# Patient Record
Sex: Female | Born: 1995
Health system: Southern US, Community
[De-identification: ages and names within clinical notes are randomized; demographics above are authoritative.]

---

## 2014-05-12 ENCOUNTER — Encounter: Payer: Self-pay | Admitting: Obstetrics & Gynecology

## 2014-07-18 ENCOUNTER — Encounter: Payer: Self-pay | Admitting: Emergency Medicine

## 2014-07-18 ENCOUNTER — Emergency Department
Admission: EM | Admit: 2014-07-18 | Discharge: 2014-07-18 | Disposition: A | Discharge status: Home / Self Care | Payer: BC Managed Care – PPO | Source: Home / Self Care | Attending: Emergency Medicine | Admitting: Emergency Medicine

## 2014-07-18 DIAGNOSIS — J208 Acute bronchitis due to other specified organisms: Secondary | ICD-10-CM

## 2014-07-18 MED ORDER — PREDNISONE 10 MG PO TABS: ORAL_TABLET | ORAL | Status: DC

## 2014-07-18 MED ORDER — AZITHROMYCIN 250 MG PO TABS: ORAL_TABLET | ORAL | Status: DC

## 2014-07-18 MED ORDER — ALBUTEROL SULFATE HFA 108 (90 BASE) MCG/ACT IN AERS: 1.0000 | INHALATION_SPRAY | Freq: Four times a day (QID) | RESPIRATORY_TRACT | Status: DC | PRN

## 2014-07-18 NOTE — Discharge Instructions (Signed)

## 2014-07-18 NOTE — ED Notes (Signed)
Cold and cough symptoms x 9 days.  Not getting better.  Pain in ribs with coughing and inspirations.

## 2014-07-18 NOTE — ED Provider Notes (Signed)
CSN: [id number]     Arrival date & time 07/18/14  1646 History   First MD Initiated Contact with Patient 07/18/14 1712     Chief Complaint  Patient presents with  . Cough  . Nasal Congestion   (Consider location/radiation/quality/duration/timing/severity/associated sxs/prior Treatment) Patient is a 18 y.o. female presenting with cough. The history is provided by the patient. No language interpreter was used.  Cough Cough characteristics:  Productive Sputum characteristics:  Clear Severity:  Moderate Onset quality:  Gradual Timing:  Constant Progression:  Worsening Chronicity:  New Smoker: no   Context: upper respiratory infection   Relieved by:  Nothing Worsened by:  Nothing tried Ineffective treatments:  Beta-agonist inhaler Associated symptoms: fever and shortness of breath   Associated symptoms: no sore throat     History reviewed. No pertinent past medical history. History reviewed. No pertinent past surgical history. History reviewed. No pertinent family history. History  Substance Use Topics  . Smoking status: Never Smoker   . Smokeless tobacco: Never Used  . Alcohol Use: No   OB History   Grav Para Term Preterm Abortions TAB SAB Ect Mult Living                 Review of Systems  Constitutional: Positive for fever.  HENT: Negative for sore throat.   Respiratory: Positive for cough and shortness of breath.   All other systems reviewed and are negative.   Allergies  Amoxicillin  Home Medications   Prior to Admission medications   Medication Sig Start Date End Date Taking? Authorizing Provider  levonorgestrel-ethinyl estradiol (SEASONALE,INTROVALE,JOLESSA) 0.15-0.03 MG tablet Take 1 tablet by mouth daily.   Yes Historical Provider, MD  albuterol (PROVENTIL HFA;VENTOLIN HFA) 108 (90 BASE) MCG/ACT inhaler Inhale 1-2 puffs into the lungs every 6 (six) hours as needed for wheezing or shortness of breath. 07/18/14   Elson AreasLeslie K Sofia, PA-C  azithromycin (ZITHROMAX  Z-PAK) 250 MG tablet Two tablets day1 then One tablet a day days 2-5 07/18/14   Elson AreasLeslie K Sofia, PA-C  predniSONE (DELTASONE) 10 MG tablet 6,5,4,3,2,1 taper 07/18/14   Elson AreasLeslie K Sofia, PA-C   BP 119/74  Pulse 88  Temp(Src) 97.6 F (36.4 C) (Oral)  Resp 16  Ht 5\' 3"  (1.6 m)  Wt 118 lb (53.524 kg)  BMI 20.91 kg/m2  SpO2 98%  LMP 04/01/2014 Physical Exam  Nursing note and vitals reviewed. Constitutional: She is oriented to person, place, and time. She appears well-developed and well-nourished.  HENT:  Head: Normocephalic.  Eyes: EOM are normal.  Neck: Normal range of motion.  Pulmonary/Chest: Effort normal.  Abdominal: She exhibits no distension.  Musculoskeletal: Normal range of motion.  Neurological: She is alert and oriented to person, place, and time.  Psychiatric: She has a normal mood and affect.    ED Course  Procedures (including critical care time) Labs Review Labs Reviewed - No data to display  Imaging Review No results found.   MDM   1. Acute bronchitis due to other specified organisms    zithromax Prednisone Albuterol inhaler Return if any problems.    Lonia SkinnerLeslie K WalkertonSofia, PA-C 07/18/14 1731

## 2015-02-28 ENCOUNTER — Encounter: Payer: Self-pay | Admitting: *Deleted

## 2015-02-28 ENCOUNTER — Emergency Department
Admission: EM | Admit: 2015-02-28 | Discharge: 2015-02-28 | Disposition: A | Discharge status: Home / Self Care | Payer: BLUE CROSS/BLUE SHIELD | Source: Home / Self Care | Attending: Family Medicine | Admitting: Family Medicine

## 2015-02-28 DIAGNOSIS — J029 Acute pharyngitis, unspecified: Secondary | ICD-10-CM | POA: Diagnosis not present

## 2015-02-28 LAB — POCT RAPID STREP A (OFFICE): Rapid Strep A Screen: NEGATIVE

## 2015-02-28 MED ORDER — PREDNISONE 20 MG PO TABS: 20.0000 mg | ORAL_TABLET | Freq: Two times a day (BID) | ORAL | Status: DC

## 2015-02-28 NOTE — Discharge Instructions (Signed)
Try warm salt water gargles for sore throat.  ° ° °Salt Water Gargle °This solution will help make your mouth and throat feel better. °HOME CARE INSTRUCTIONS  °· Mix 1 teaspoon of salt in 8 ounces of warm water. °· Gargle with this solution as much or often as you need or as directed. Swish and gargle gently if you have any sores or wounds in your mouth. °· Do not swallow this mixture. °Document Released: 06/08/2004 Document Revised: 11/27/2011 Document Reviewed: 10/30/2008 °ExitCare® Patient Information ©2015 [employer]. This information is not intended to replace advice given to you by your health care provider. Make sure you discuss any questions you have with your health care provider. ° °

## 2015-02-28 NOTE — ED Provider Notes (Signed)
CSN: [id number]     Arrival date & time 02/28/15  1417 History   First MD Initiated Contact with Patient 02/28/15 1457     Chief Complaint  Patient presents with  . Sore Throat      HPI Comments: Patient complains of left side sore throat for one week without other symptoms.  No fevers, chills, and sweats.  She feels well otherwise.  The history is provided by the patient and a parent.    History reviewed. No pertinent past medical history. History reviewed. No pertinent past surgical history. History reviewed. No pertinent family history. History  Substance Use Topics  . Smoking status: Never Smoker   . Smokeless tobacco: Never Used  . Alcohol Use: No   OB History    No data available     Review of Systems + sore throat No cough No pleuritic pain No wheezing No nasal congestion No post-nasal drainage No sinus pain/pressure No itchy/red eyes No earache No hemoptysis No SOB No fever/chills No nausea No vomiting No abdominal pain No diarrhea No urinary symptoms No skin rash No fatigue No myalgias No headache Used OTC meds without relief  Allergies  Amoxicillin  Home Medications   Prior to Admission medications   Medication Sig Start Date End Date Taking? Authorizing Provider  ibuprofen (ADVIL,MOTRIN) 400 MG tablet Take 400 mg by mouth every 6 (six) hours as needed.   Yes Historical Provider, MD  albuterol (PROVENTIL HFA;VENTOLIN HFA) 108 (90 BASE) MCG/ACT inhaler Inhale 1-2 puffs into the lungs every 6 (six) hours as needed for wheezing or shortness of breath. 07/18/14   Elson Areas, PA-C  levonorgestrel-ethinyl estradiol (SEASONALE,INTROVALE,JOLESSA) 0.15-0.03 MG tablet Take 1 tablet by mouth daily.    Historical Provider, MD  predniSONE (DELTASONE) 20 MG tablet Take 1 tablet (20 mg total) by mouth 2 (two) times daily. Take with food. 02/28/15   Lattie Haw, MD   BP 117/71 mmHg  Pulse 77  Temp(Src) 98.3 F (36.8 C) (Oral)  Ht 5\' 3"  (1.6 m)  Wt 120  lb 8 oz (54.658 kg)  BMI 21.35 kg/m2  SpO2 98% Physical Exam Nursing notes and Vital Signs reviewed. Appearance:  Patient appears stated age, and in no acute distress Eyes:  Pupils are equal, round, and reactive to light and accomodation.  Extraocular movement is intact.  Conjunctivae are not inflamed  Ears:  Canals normal.  Tympanic membranes normal.  Nose:  Normal turbinates.  No sinus tenderness.     Pharynx:  Normal Neck:  Supple.  Anterior and posterior nodes are not tender or enlarged.  Left submandibular nodes are tender but not enlarged. Lungs:  Clear to auscultation.  Breath sounds are equal.  Heart:  Regular rate and rhythm without murmurs, rubs, or gallops.  Skin:  No rash present.   ED Course  Procedures  None    Labs Reviewed  STREP A DNA PROBE  POCT RAPID STREP A (OFFICE) negative      MDM   1. Acute pharyngitis, unspecified pharyngitis type; ?etiology    Throat culture pending. Begin prednisone burst. Try warm salt water gargles for sore throat.  Followup with ENT if not resolved one week.    Lattie Haw, MD 02/28/15 [phone]

## 2015-02-28 NOTE — ED Notes (Signed)
Pt complains of sore throat for 1 wk.  She states it only hurts on the L side and is a sharp pain when swalling.  She says her Lympnode on L side has been swollen and tender.

## 2015-03-01 LAB — STREP A DNA PROBE: GASP: NEGATIVE

## 2015-03-03 ENCOUNTER — Telehealth: Payer: Self-pay | Admitting: *Deleted

## 2015-11-01 ENCOUNTER — Encounter: Payer: Self-pay | Admitting: *Deleted

## 2015-11-01 ENCOUNTER — Emergency Department
Admission: EM | Admit: 2015-11-01 | Discharge: 2015-11-01 | Disposition: A | Discharge status: Home / Self Care | Payer: BLUE CROSS/BLUE SHIELD | Source: Home / Self Care | Attending: Family Medicine | Admitting: Family Medicine

## 2015-11-01 DIAGNOSIS — N390 Urinary tract infection, site not specified: Secondary | ICD-10-CM

## 2015-11-01 DIAGNOSIS — R300 Dysuria: Secondary | ICD-10-CM | POA: Diagnosis not present

## 2015-11-01 LAB — POCT URINALYSIS DIP (MANUAL ENTRY)
Glucose, UA: NEGATIVE
Nitrite, UA: POSITIVE — AB
SPEC GRAV UA: 1.015 (ref 1.005–1.03)
UROBILINOGEN UA: 1 (ref 0–1)
pH, UA: 6.5 (ref 5–8)

## 2015-11-01 MED ORDER — PHENAZOPYRIDINE HCL 200 MG PO TABS: 200.0000 mg | ORAL_TABLET | Freq: Three times a day (TID) | ORAL | Status: DC

## 2015-11-01 MED ORDER — CIPROFLOXACIN HCL 500 MG PO TABS: 500.0000 mg | ORAL_TABLET | Freq: Two times a day (BID) | ORAL | Status: DC

## 2015-11-01 NOTE — ED Notes (Signed)
Pt c/o dysuria and LBP x 2 days. She has taken Cranberry pills. Denies fever. She took Aleve at 0800 today.

## 2015-11-01 NOTE — Discharge Instructions (Signed)
Increase fluid intake. °If symptoms become significantly worse during the night or over the weekend, proceed to the local emergency room.  ° ° °Urinary Tract Infection °Urinary tract infections (UTIs) can develop anywhere along your urinary tract. Your urinary tract is your body's drainage system for removing wastes and extra water. Your urinary tract includes two kidneys, two ureters, a bladder, and a urethra. Your kidneys are a pair of bean-shaped organs. Each kidney is about the size of your fist. They are located below your ribs, one on each side of your spine. °CAUSES °Infections are caused by microbes, which are microscopic organisms, including fungi, viruses, and bacteria. These organisms are so small that they can only be seen through a microscope. Bacteria are the microbes that most commonly cause UTIs. °SYMPTOMS  °Symptoms of UTIs may vary by age and gender of the patient and by the location of the infection. Symptoms in young women typically include a frequent and intense urge to urinate and a painful, burning feeling in the bladder or urethra during urination. Older women and men are more likely to be tired, shaky, and weak and have muscle aches and abdominal pain. A fever may mean the infection is in your kidneys. Other symptoms of a kidney infection include pain in your back or sides below the ribs, nausea, and vomiting. °DIAGNOSIS °To diagnose a UTI, your caregiver will ask you about your symptoms. Your caregiver will also ask you to provide a urine sample. The urine sample will be tested for bacteria and white blood cells. White blood cells are made by your body to help fight infection. °TREATMENT  °Typically, UTIs can be treated with medication. Because most UTIs are caused by a bacterial infection, they usually can be treated with the use of antibiotics. The choice of antibiotic and length of treatment depend on your symptoms and the type of bacteria causing your infection. °HOME CARE  INSTRUCTIONS °· If you were prescribed antibiotics, take them exactly as your caregiver instructs you. Finish the medication even if you feel better after you have only taken some of the medication. °· Drink enough water and fluids to keep your urine clear or pale yellow. °· Avoid caffeine, tea, and carbonated beverages. They tend to irritate your bladder. °· Empty your bladder often. Avoid holding urine for long periods of time. °· Empty your bladder before and after sexual intercourse. °· After a bowel movement, women should cleanse from front to back. Use each tissue only once. °SEEK MEDICAL CARE IF:  °· You have back pain. °· You develop a fever. °· Your symptoms do not begin to resolve within 3 days. °SEEK IMMEDIATE MEDICAL CARE IF:  °· You have severe back pain or lower abdominal pain. °· You develop chills. °· You have nausea or vomiting. °· You have continued burning or discomfort with urination. °MAKE SURE YOU:  °· Understand these instructions. °· Will watch your condition. °· Will get help right away if you are not doing well or get worse. °  °This information is not intended to replace advice given to you by your health care provider. Make sure you discuss any questions you have with your health care provider. °  °Document Released: 06/14/2005 Document Revised: 05/26/2015 Document Reviewed: 10/13/2011 °Elsevier Interactive Patient Education ©2016 [employer] ° °

## 2015-11-01 NOTE — ED Provider Notes (Signed)
CSN: [id number]     Arrival date & time 11/01/15  1821 History   First MD Initiated Contact with Patient 11/01/15 1903     Chief Complaint  Patient presents with  . Dysuria      HPI Comments: Patient complains of one week history of urinary frequency, dysuria, nocturia, and low back ache, becoming worse over the past two days with fever/chills, and nausea.  Patient is a 20 y.o. female presenting with dysuria. The history is provided by the patient.  Dysuria Pain quality:  Burning Pain severity:  Mild Onset quality:  Gradual Duration:  1 week Timing:  Constant Progression:  Worsening Chronicity:  New Recent urinary tract infections: no   Relieved by:  Nothing Worsened by:  Nothing tried Ineffective treatments:  Cranberry juice Urinary symptoms: frequent urination and hesitancy   Urinary symptoms: no discolored urine, no foul-smelling urine, no hematuria and no bladder incontinence   Associated symptoms: fever and nausea   Associated symptoms: no abdominal pain, no flank pain, no genital lesions, no vaginal discharge and no vomiting     History reviewed. No pertinent past medical history. History reviewed. No pertinent past surgical history. History reviewed. No pertinent family history. Social History  Substance Use Topics  . Smoking status: Never Smoker   . Smokeless tobacco: Never Used  . Alcohol Use: No   OB History    No data available     Review of Systems  Constitutional: Positive for fever.  Gastrointestinal: Positive for nausea. Negative for vomiting and abdominal pain.  Genitourinary: Positive for dysuria. Negative for flank pain and vaginal discharge.  All other systems reviewed and are negative.   Allergies  Amoxicillin  Home Medications   Prior to Admission medications   Medication Sig Start Date End Date Taking? Authorizing Provider  albuterol (PROVENTIL HFA;VENTOLIN HFA) 108 (90 BASE) MCG/ACT inhaler Inhale 1-2 puffs into the lungs every 6 (six)  hours as needed for wheezing or shortness of breath. 07/18/14   Elson Areas, PA-C  ciprofloxacin (CIPRO) 500 MG tablet Take 1 tablet (500 mg total) by mouth 2 (two) times daily. 11/01/15   Lattie Haw, MD  levonorgestrel-ethinyl estradiol (SEASONALE,INTROVALE,JOLESSA) 0.15-0.03 MG tablet Take 1 tablet by mouth daily.    Historical Provider, MD  phenazopyridine (PYRIDIUM) 200 MG tablet Take 1 tablet (200 mg total) by mouth 3 (three) times daily. Take after meals. 11/01/15   Lattie Haw, MD   Meds Ordered and Administered this Visit  Medications - No data to display  BP 122/76 mmHg  Pulse 63  Temp(Src) 98.1 F (36.7 C) (Oral)  Resp 16  Ht  (1.6 m)  Wt 123 lb (55.792 kg)  BMI 21.79 kg/m2  SpO2 100%  LMP 10/25/2015 No data found.   Physical Exam Nursing notes and Vital Signs reviewed. Appearance:  Patient appears stated age, and in no acute distress.    Eyes:  Pupils are equal, round, and reactive to light and accomodation.  Extraocular movement is intact.  Conjunctivae are not inflamed   Pharynx:  Normal; moist mucous membranes  Neck:  Supple.  No adenopathy Lungs:  Clear to auscultation.  Breath sounds are equal.  Moving air well. Heart:  Regular rate and rhythm without murmurs, rubs, or gallops.  Abdomen:  Nontender without masses or hepatosplenomegaly.  Bowel sounds are present.  No CVA or flank tenderness.  Extremities:  No edema.  Skin:  No rash present.    ED Course  Procedures  None  Labs Reviewed  POCT URINALYSIS DIP (MANUAL ENTRY) - Abnormal; Notable for the following:    Color, UA brown (*)    Clarity, UA cloudy (*)    Bilirubin, UA small (*)    Ketones, POC UA trace (5) (*)    Blood, UA large (*)    Protein Ur, POC >=300 (*)    Nitrite, UA Positive (*)    Leukocytes, UA large (3+) (*)    All other components within normal limits  URINE CULTURE      MDM   1. Dysuria   2. Urinary tract infection without hematuria, site unspecified    Urine  culture pending. Begin Cipro  BID.  Rx for Pyridium. Increase fluid intake. If symptoms become significantly worse during the night or over the weekend, proceed to the local emergency room.  Followup with Family Doctor if not improved in one week.     Lattie Haw, MD 11/08/15 [phone]

## 2015-11-04 ENCOUNTER — Telehealth: Payer: Self-pay | Admitting: *Deleted

## 2015-11-04 LAB — URINE CULTURE: Colony Count: >=100000

## 2016-06-13 DIAGNOSIS — Z0000 Encounter for general adult medical examination without abnormal findings: Secondary | ICD-10-CM | POA: Diagnosis not present

## 2016-06-13 DIAGNOSIS — Z6823 Body mass index (BMI) 23.0-23.9, adult: Secondary | ICD-10-CM | POA: Diagnosis not present

## 2016-07-11 ENCOUNTER — Ambulatory Visit (INDEPENDENT_AMBULATORY_CARE_PROVIDER_SITE_OTHER): Payer: BLUE CROSS/BLUE SHIELD | Admitting: Obstetrics & Gynecology

## 2016-07-11 ENCOUNTER — Encounter: Payer: Self-pay | Admitting: Obstetrics & Gynecology

## 2016-07-11 VITALS — BP 132/72 | HR 125 | Resp 16 | Ht 62.5 in | Wt 132.0 lb

## 2016-07-11 DIAGNOSIS — Z113 Encounter for screening for infections with a predominantly sexual mode of transmission: Secondary | ICD-10-CM | POA: Diagnosis not present

## 2016-07-11 DIAGNOSIS — Z3009 Encounter for other general counseling and advice on contraception: Secondary | ICD-10-CM

## 2016-07-11 DIAGNOSIS — Z23 Encounter for immunization: Secondary | ICD-10-CM | POA: Diagnosis not present

## 2016-07-11 DIAGNOSIS — Z124 Encounter for screening for malignant neoplasm of cervix: Secondary | ICD-10-CM | POA: Diagnosis not present

## 2016-07-11 DIAGNOSIS — Z01419 Encounter for gynecological examination (general) (routine) without abnormal findings: Secondary | ICD-10-CM

## 2016-07-11 MED ORDER — LEVONORGEST-ETH ESTRAD 91-DAY 0.15-0.03 MG PO TABS: 1.0000 | ORAL_TABLET | Freq: Every day | ORAL | 5 refills | Status: DC

## 2016-07-11 NOTE — Addendum Note (Signed)
Addended by: Kathie DikeSOLA, DEANNA J on: 07/11/2016 03:24 PM   Modules accepted: Orders

## 2016-07-11 NOTE — Addendum Note (Signed)
Addended by: Kathie DikeSOLA, DEANNA J on: 07/11/2016 03:52 PM   Modules accepted: Orders

## 2016-07-11 NOTE — Progress Notes (Signed)
Subjective:     Lauren Harrell is a 20 y.o. female here for a routine exam.  Current complaints: no.  Pt is sexually active but not currently.  Pt on OCPs with no [rpblems  Pt was using condoms MOST times.    Gynecologic History Patient's last menstrual period was 05/11/2016. Contraception: OCP (estrogen/progesterone) Quasense.15/.03 Last Pap: never had  Last mammogram: n/a  Obstetric History OB History  No data available   The following portions of the patient's history were reviewed and updated as appropriate: allergies, current medications, past family history, past medical history, past social history, past surgical history and problem list.  Review of Systems Pertinent items are noted in HPI.    Objective:  BP 132/72   Pulse (!) 125   Resp 16   Ht 5' 2.5" (1.588 m)   Wt 132 lb (59.9 kg)   LMP 05/11/2016   BMI 23.76 kg/m  General Appearance:    Alert, cooperative, no distress, appears stated age  Head:    Normocephalic, without obvious abnormality, atraumatic  Eyes:    conjunctiva/corneas clear, EOM's intact, both eyes  Ears:    Normal external ear canals, both ears  Nose:   Nares normal, septum midline, mucosa normal, no drainage    or sinus tenderness  Throat:   Lips, mucosa, and tongue normal; teeth and gums normal  Neck:   Supple, symmetrical, trachea midline, no adenopathy;    thyroid:  no enlargement/tenderness/nodules  Back:     Symmetric, no curvature, ROM normal, no CVA tenderness  Lungs:     Clear to auscultation bilaterally, respirations unlabored  Chest Wall:    No tenderness or deformity   Heart:    Regular rate and rhythm, S1 and S2 normal, no murmur, rub   or gallop  Breast Exam:    No tenderness, masses, or nipple abnormality- pt does have bilateral nipple piercings  Abdomen:     Soft, non-tender, bowel sounds active all four quadrants,    no masses, no organomegaly. Pt had umbilical peircing  Genitalia:    Normal female without lesion, discharge or  tenderness     Extremities:   Extremities normal, atraumatic, no cyanosis or edema  Pulses:   2+ and symmetric all extremities  Skin:   Skin color, texture, turgor normal, no rashes or lesions; multiple tattoos    Assessment:    Healthy female exam.   Contraception counseling STI screen   Plan:    Follow up in: 1 year.    F/u PAP and cx Refilled OCPs generic Seasonale  Eliot Popper L. Harraway-Smith, M.D., [facility] CoreFACOG

## 2016-07-11 NOTE — Progress Notes (Signed)
annual

## 2016-07-11 NOTE — Patient Instructions (Signed)
Oral Contraception Use Oral contraceptive pills (OCPs) are medicines taken to prevent pregnancy. OCPs work by preventing the ovaries from releasing eggs. The hormones in OCPs also cause the cervical mucus to thicken, preventing the sperm from entering the uterus. The hormones also cause the uterine lining to become thin, not allowing a fertilized egg to attach to the inside of the uterus. OCPs are highly effective when taken exactly as prescribed. However, OCPs do not prevent sexually transmitted diseases (STDs). Safe sex practices, such as using condoms along with an OCP, can help prevent STDs. Before taking OCPs, you may have a physical exam and Pap test. Your health care provider may also order blood tests if necessary. Your health care provider will make sure you are a good candidate for oral contraception. Discuss with your health care provider the possible side effects of the OCP you may be prescribed. When starting an OCP, it can take 2 to 3 months for the body to adjust to the changes in hormone levels in your body.  HOW TO TAKE ORAL CONTRACEPTIVE PILLS Your health care provider may advise you on how to start taking the first cycle of OCPs. Otherwise, you can:   Start on day 1 of your menstrual period. You will not need any backup contraceptive protection with this start time.   Start on the first Sunday after your menstrual period or the day you get your prescription. In these cases, you will need to use backup contraceptive protection for the first week.   Start the pill at any time of your cycle. If you take the pill within 5 days of the start of your period, you are protected against pregnancy right away. In this case, you will not need a backup form of birth control. If you start at any other time of your menstrual cycle, you will need to use another form of birth control for 7 days. If your OCP is the type called a minipill, it will protect you from pregnancy after taking it for 2 days (48  hours). After you have started taking OCPs:   If you forget to take 1 pill, take it as soon as you remember. Take the next pill at the regular time.   If you miss 2 or more pills, call your health care provider because different pills have different instructions for missed doses. Use backup birth control until your next menstrual period starts.   If you use a 28-day pack that contains inactive pills and you miss 1 of the last 7 pills (pills with no hormones), it will not matter. Throw away the rest of the non-hormone pills and start a new pill pack.  No matter which day you start the OCP, you will always start a new pack on that same day of the week. Have an extra pack of OCPs and a backup contraceptive method available in case you miss some pills or lose your OCP pack.  HOME CARE INSTRUCTIONS   Do not smoke.   Always use a condom to protect against STDs. OCPs do not protect against STDs.   Use a calendar to mark your menstrual period days.   Read the information and directions that came with your OCP. Talk to your health care provider if you have questions.  SEEK MEDICAL CARE IF:   You develop nausea and vomiting.   You have abnormal vaginal discharge or bleeding.   You develop a rash.   You miss your menstrual period.   You are losing   your hair.   You need treatment for mood swings or depression.   You get dizzy when taking the OCP.   You develop acne from taking the OCP.   You become pregnant.  SEEK IMMEDIATE MEDICAL CARE IF:   You develop chest pain.   You develop shortness of breath.   You have an uncontrolled or severe headache.   You develop numbness or slurred speech.   You develop visual problems.   You develop pain, redness, and swelling in the legs.    This information is not intended to replace advice given to you by your health care provider. Make sure you discuss any questions you have with your health care provider.   Document  Released: 08/24/2011 Document Revised: 09/25/2014 Document Reviewed: 02/23/2013 Elsevier Interactive Patient Education 2016 [employer]. Sexually Transmitted Disease A sexually transmitted disease (STD) is a disease or infection that may be passed (transmitted) from person to person, usually during sexual activity. This may happen by way of saliva, semen, blood, vaginal mucus, or urine. Common STDs include:  Gonorrhea.  Chlamydia.  Syphilis.  HIV and AIDS.  Genital herpes.  Hepatitis B and C.  Trichomonas.  Human papillomavirus (HPV).  Pubic lice.  Scabies.  Mites.  Bacterial vaginosis. WHAT ARE CAUSES OF STDs? An STD may be caused by bacteria, a virus, or parasites. STDs are often transmitted during sexual activity if one person is infected. However, they may also be transmitted through nonsexual means. STDs may be transmitted after:   Sexual intercourse with an infected person.  Sharing sex toys with an infected person.  Sharing needles with an infected person or using unclean piercing or tattoo needles.  Having intimate contact with the genitals, mouth, or rectal areas of an infected person.  Exposure to infected fluids during birth. WHAT ARE THE SIGNS AND SYMPTOMS OF STDs? Different STDs have different symptoms. Some people may not have any symptoms. If symptoms are present, they may include:  Painful or bloody urination.  Pain in the pelvis, abdomen, vagina, anus, throat, or eyes.  A skin rash, itching, or irritation.  Growths, ulcerations, blisters, or sores in the genital and anal areas.  Abnormal vaginal discharge with or without bad odor.  Penile discharge in men.  Fever.  Pain or bleeding during sexual intercourse.  Swollen glands in the groin area.  Yellow skin and eyes (jaundice). This is seen with hepatitis.  Swollen testicles.  Infertility.  Sores and blisters in the mouth. HOW ARE STDs DIAGNOSED? To make a diagnosis, your health  care provider may:  Take a medical history.  Perform a physical exam.  Take a sample of any discharge to examine.  Swab the throat, cervix, opening to the penis, rectum, or vagina for testing.  Test a sample of your first morning urine.  Perform blood tests.  Perform a Pap test, if this applies.  Perform a colposcopy.  Perform a laparoscopy. HOW ARE STDs TREATED? Treatment depends on the STD. Some STDs may be treated but not cured.  Chlamydia, gonorrhea, trichomonas, and syphilis can be cured with antibiotic medicine.  Genital herpes, hepatitis, and HIV can be treated, but not cured, with prescribed medicines. The medicines lessen symptoms.  Genital warts from HPV can be treated with medicine or by freezing, burning (electrocautery), or surgery. Warts may come back.  HPV cannot be cured with medicine or surgery. However, abnormal areas may be removed from the cervix, vagina, or vulva.  If your diagnosis is confirmed, your recent sexual partners  need treatment. This is true even if they are symptom-free or have a negative culture or evaluation. They should not have sex until their health care providers say it is okay.  Your health care provider may test you for infection again 3 months after treatment. HOW CAN I REDUCE MY RISK OF GETTING AN STD? Take these steps to reduce your risk of getting an STD:  Use latex condoms, dental dams, and water-soluble lubricants during sexual activity. Do not use petroleum jelly or oils.  Avoid having multiple sex partners.  Do not have sex with someone who has other sex partners  Do not have sex with anyone you do not know or who is at high risk for an STD.  Avoid risky sex practices that can break your skin.  Do not have sex if you have open sores on your mouth or skin.  Avoid drinking too much alcohol or taking illegal drugs. Alcohol and drugs can affect your judgment and put you in a vulnerable position.  Avoid engaging in oral and  anal sex acts.  Get vaccinated for HPV and hepatitis. If you have not received these vaccines in the past, talk to your health care provider about whether one or both might be right for you.  If you are at risk of being infected with HIV, it is recommended that you take a prescription medicine daily to prevent HIV infection. This is called pre-exposure prophylaxis (PrEP). You are considered at risk if:  You are a man who has sex with other men (MSM).  You are a heterosexual man or woman and are sexually active with more than one partner.  You take drugs by injection.  You are sexually active with a partner who has HIV.  Talk with your health care provider about whether you are at high risk of being infected with HIV. If you choose to begin PrEP, you should first be tested for HIV. You should then be tested every 3 months for as long as you are taking PrEP. WHAT SHOULD I DO IF I THINK I HAVE AN STD?  See your health care provider.  Tell your sexual partner(s). They should be tested and treated for any STDs.  Do not have sex until your health care provider says it is okay. WHEN SHOULD I GET IMMEDIATE MEDICAL CARE? Contact your health care provider right away if:   You have severe abdominal pain.  You are a man and notice swelling or pain in your testicles.  You are a woman and notice swelling or pain in your vagina.   This information is not intended to replace advice given to you by your health care provider. Make sure you discuss any questions you have with your health care provider.   Document Released: 11/25/2002 Document Revised: 09/25/2014 Document Reviewed: 03/25/2013 Elsevier Interactive Patient Education [employer] Elsevier Inc.

## 2016-07-13 LAB — CYTOLOGY - PAP
CHLAMYDIA, DNA PROBE: NEGATIVE
DIAGNOSIS: NEGATIVE
Neisseria Gonorrhea: NEGATIVE

## 2016-07-28 DIAGNOSIS — J069 Acute upper respiratory infection, unspecified: Secondary | ICD-10-CM | POA: Diagnosis not present

## 2016-07-28 DIAGNOSIS — J209 Acute bronchitis, unspecified: Secondary | ICD-10-CM | POA: Diagnosis not present

## 2016-12-31 DIAGNOSIS — J209 Acute bronchitis, unspecified: Secondary | ICD-10-CM | POA: Diagnosis not present

## 2016-12-31 DIAGNOSIS — J3489 Other specified disorders of nose and nasal sinuses: Secondary | ICD-10-CM | POA: Diagnosis not present

## 2016-12-31 DIAGNOSIS — R05 Cough: Secondary | ICD-10-CM | POA: Diagnosis not present

## 2016-12-31 DIAGNOSIS — R062 Wheezing: Secondary | ICD-10-CM | POA: Diagnosis not present

## 2017-07-16 ENCOUNTER — Other Ambulatory Visit: Payer: Self-pay

## 2017-07-16 DIAGNOSIS — Z3009 Encounter for other general counseling and advice on contraception: Secondary | ICD-10-CM

## 2017-07-16 DIAGNOSIS — Z01419 Encounter for gynecological examination (general) (routine) without abnormal findings: Secondary | ICD-10-CM

## 2017-07-16 MED ORDER — LEVONORGEST-ETH ESTRAD 91-DAY 0.15-0.03 MG PO TABS: 1.0000 | ORAL_TABLET | Freq: Every day | ORAL | 0 refills | Status: DC

## 2017-07-31 ENCOUNTER — Ambulatory Visit: Payer: BLUE CROSS/BLUE SHIELD | Admitting: Obstetrics and Gynecology

## 2017-08-07 ENCOUNTER — Ambulatory Visit: Payer: BLUE CROSS/BLUE SHIELD | Admitting: Obstetrics and Gynecology

## 2017-08-21 ENCOUNTER — Encounter: Payer: Self-pay | Admitting: Obstetrics and Gynecology

## 2017-08-21 ENCOUNTER — Ambulatory Visit (INDEPENDENT_AMBULATORY_CARE_PROVIDER_SITE_OTHER): Payer: BLUE CROSS/BLUE SHIELD | Admitting: Obstetrics and Gynecology

## 2017-08-21 VITALS — BP 121/79 | HR 74 | Ht 62.0 in | Wt 126.0 lb

## 2017-08-21 DIAGNOSIS — Z23 Encounter for immunization: Secondary | ICD-10-CM

## 2017-08-21 DIAGNOSIS — Z113 Encounter for screening for infections with a predominantly sexual mode of transmission: Secondary | ICD-10-CM | POA: Diagnosis not present

## 2017-08-21 DIAGNOSIS — Z01419 Encounter for gynecological examination (general) (routine) without abnormal findings: Secondary | ICD-10-CM | POA: Diagnosis not present

## 2017-08-21 DIAGNOSIS — N898 Other specified noninflammatory disorders of vagina: Secondary | ICD-10-CM

## 2017-08-21 DIAGNOSIS — F17200 Nicotine dependence, unspecified, uncomplicated: Secondary | ICD-10-CM

## 2017-08-21 DIAGNOSIS — Z3009 Encounter for other general counseling and advice on contraception: Secondary | ICD-10-CM

## 2017-08-21 NOTE — Progress Notes (Signed)
GYNECOLOGY ANNUAL PREVENTATIVE CARE ENCOUNTER NOTE  Subjective:   Lauren Harrell is a 21 y.o. G0P0000 female here for a annual gynecologic exam. Current complaints: some extra discharge. On seasonale with periods 4x/yr, happy with this method, no desire to change. Doesn't forget pills. Not currently sexually active, declines GC/CT testing but has never been tested for HIV/RPR/Hepatitis and would like to be tested on those.  Denies abnormal vaginal bleeding, discharge, pelvic pain, problems with intercourse or other gynecologic concerns.    Gynecologic History Patient's last menstrual period was 08/07/2017 (exact date). Contraception: OCP (estrogen/progesterone) Last Pap: 06/2016. Results were: normal Last mammogram: na/  Obstetric History OB History  Gravida Para Term Preterm AB Living  0 0 0 0 0 0  SAB TAB Ectopic Multiple Live Births  0 0 0 0 0        History reviewed. No pertinent past medical history.  History reviewed. No pertinent surgical history.  Current Outpatient Medications on File Prior to Visit  Medication Sig Dispense Refill  . levonorgestrel-ethinyl estradiol (SEASONALE,INTROVALE,JOLESSA) 0.15-0.03 MG tablet Take 1 tablet by mouth daily. 1 Package 0  . albuterol (PROVENTIL HFA;VENTOLIN HFA) 108 (90 BASE) MCG/ACT inhaler Inhale 1-2 puffs into the lungs every 6 (six) hours as needed for wheezing or shortness of breath. (Patient not taking: Reported on 08/21/2017) 1 Inhaler 0   No current facility-administered medications on file prior to visit.     Allergies  Allergen Reactions  . Amoxicillin     Social History   Socioeconomic History  . Marital status: Single    Spouse name: Not on file  . Number of children: Not on file  . Years of education: Not on file  . Highest education level: Not on file  Social Needs  . Financial resource strain: Not on file  . Food insecurity - worry: Not on file  . Food insecurity - inability: Not on file  . Transportation  needs - medical: Not on file  . Transportation needs - non-medical: Not on file  Occupational History  . Occupation: waitress  Tobacco Use  . Smoking status: Never Smoker  . Smokeless tobacco: Never Used  Substance and Sexual Activity  . Alcohol use: No  . Drug use: No  . Sexual activity: Yes    Birth control/protection: Pill  Other Topics Concern  . Not on file  Social History Narrative  . Not on file    Family History  Problem Relation Age of Onset  . Hypertension Mother   . Hypertension Maternal Grandfather     The following portions of the patient's history were reviewed and updated as appropriate: allergies, current medications, past family history, past medical history, past social history, past surgical history and problem list.  Review of Systems Pertinent items are noted in HPI.   Objective:  BP 121/79   Pulse 74   Ht 5\' 2"  (1.575 m)   Wt 126 lb (57.2 kg)   LMP 08/07/2017 (Exact Date)   BMI 23.05 kg/m  CONSTITUTIONAL: Well-developed, well-nourished female in no acute distress.  HENT:  Normocephalic, atraumatic, External right and left ear normal. Oropharynx is clear and moist EYES: Conjunctivae and EOM are normal. Pupils are equal, round, and reactive to light. No scleral icterus.  NECK: Normal range of motion, supple, no masses.  Normal thyroid.  SKIN: Skin is warm and dry. No rash noted. Not diaphoretic. No erythema. No pallor. NEUROLOGIC: Alert and oriented to person, place, and time. Normal reflexes, muscle tone coordination. No  cranial nerve deficit noted. PSYCHIATRIC: Normal mood and affect. Normal behavior. Normal judgment and thought content. CARDIOVASCULAR: Normal heart rate noted, regular rhythm RESPIRATORY: Clear to auscultation bilaterally. Effort and breath sounds normal, no problems with respiration noted. BREASTS: Symmetric in size. No masses, skin changes, nipple drainage, or lymphadenopathy. Right nipple ring present ABDOMEN: Soft, normal bowel  sounds, no distention noted.  No tenderness, rebound or guarding.  PELVIC: Normal appearing external genitalia; normal appearing vaginal mucosa and cervix.  No abnormal discharge noted.  Pap smear obtained.  Normal uterine size, no other palpable masses, no uterine or adnexal tenderness. MUSCULOSKELETAL: Normal range of motion. No tenderness.  No cyanosis, clubbing, or edema.  2+ distal pulses.   Assessment and Plan:    1. Well woman exam - HIV antibody (with reflex) - RPR - Hepatitis C antibody - Hepatitis B surface antigen - Flu Vaccine QUAD 36+ mos IM (Fluarix, Quad PF) Pap smear not indicated today  2. Vaginal discharge - Cervicovaginal ancillary only  3. Smoking Currently vaping Encouraged to quit  4. Encounter for counseling regarding contraception Happy with current method, no changes   Will follow up results of STI screen and manage accordingly. Flu vaccine today  Routine preventative health maintenance measures emphasized. Please refer to After Visit Summary for other counseling recommendations.    Baldemar LenisK. Meryl Davis, M.D. Attending Obstetrician & Gynecologist, [facility], [facility]

## 2017-08-22 ENCOUNTER — Encounter: Payer: Self-pay | Admitting: Obstetrics and Gynecology

## 2017-08-22 LAB — HIV ANTIBODY (ROUTINE TESTING W REFLEX): HIV 1&2 Ab, 4th Generation: NONREACTIVE

## 2017-08-22 LAB — HEPATITIS B SURFACE ANTIGEN: HEP B S AG: NONREACTIVE

## 2017-08-22 LAB — HEPATITIS C ANTIBODY
Hepatitis C Ab: NONREACTIVE
SIGNAL TO CUT-OFF: 0.01 (ref <1.00)

## 2017-08-22 LAB — RPR: RPR: NONREACTIVE

## 2017-08-23 LAB — CERVICOVAGINAL ANCILLARY ONLY
Bacterial vaginitis: NEGATIVE
Candida vaginitis: NEGATIVE
TRICH (WINDOWPATH): NEGATIVE

## 2017-10-15 ENCOUNTER — Other Ambulatory Visit: Payer: Self-pay

## 2017-10-15 DIAGNOSIS — Z01419 Encounter for gynecological examination (general) (routine) without abnormal findings: Secondary | ICD-10-CM

## 2017-10-15 DIAGNOSIS — Z3009 Encounter for other general counseling and advice on contraception: Secondary | ICD-10-CM

## 2017-10-16 ENCOUNTER — Telehealth: Payer: Self-pay | Admitting: *Deleted

## 2017-10-16 DIAGNOSIS — Z3009 Encounter for other general counseling and advice on contraception: Secondary | ICD-10-CM

## 2017-10-16 DIAGNOSIS — Z01419 Encounter for gynecological examination (general) (routine) without abnormal findings: Secondary | ICD-10-CM

## 2017-10-16 MED ORDER — LEVONORGEST-ETH ESTRAD 91-DAY 0.15-0.03 MG PO TABS: 1.0000 | ORAL_TABLET | Freq: Every day | ORAL | 3 refills | Status: DC

## 2017-10-16 NOTE — Telephone Encounter (Signed)
Rf authorization received from [facility] for Seasonale.  Pt was seen in Dec and RF was sent.

## 2018-10-10 ENCOUNTER — Other Ambulatory Visit: Payer: Self-pay | Admitting: Obstetrics & Gynecology

## 2018-10-10 DIAGNOSIS — Z3009 Encounter for other general counseling and advice on contraception: Secondary | ICD-10-CM

## 2018-10-10 DIAGNOSIS — Z01419 Encounter for gynecological examination (general) (routine) without abnormal findings: Secondary | ICD-10-CM

## 2018-12-01 ENCOUNTER — Other Ambulatory Visit: Payer: Self-pay

## 2018-12-01 ENCOUNTER — Encounter: Payer: Self-pay | Admitting: Emergency Medicine

## 2018-12-01 ENCOUNTER — Emergency Department
Admission: EM | Admit: 2018-12-01 | Discharge: 2018-12-01 | Disposition: A | Discharge status: Home / Self Care | Payer: BLUE CROSS/BLUE SHIELD | Source: Home / Self Care | Attending: Emergency Medicine | Admitting: Emergency Medicine

## 2018-12-01 DIAGNOSIS — J101 Influenza due to other identified influenza virus with other respiratory manifestations: Secondary | ICD-10-CM

## 2018-12-01 LAB — POCT INFLUENZA A/B
Influenza A, POC: NEGATIVE
Influenza B, POC: NEGATIVE

## 2018-12-01 MED ORDER — ALBUTEROL SULFATE HFA 108 (90 BASE) MCG/ACT IN AERS: 1.0000 | INHALATION_SPRAY | Freq: Four times a day (QID) | RESPIRATORY_TRACT | 2 refills | Status: DC | PRN

## 2018-12-01 MED ORDER — BENZONATATE 100 MG PO CAPS: 100.0000 mg | ORAL_CAPSULE | Freq: Three times a day (TID) | ORAL | 0 refills | Status: DC | PRN

## 2018-12-01 MED ORDER — OSELTAMIVIR PHOSPHATE 75 MG PO CAPS: 75.0000 mg | ORAL_CAPSULE | Freq: Two times a day (BID) | ORAL | 0 refills | Status: DC

## 2018-12-01 NOTE — ED Triage Notes (Signed)
Patient reports congestion 6 days ago and thought she was getting a sinus infection; cleared until 3 days ago when she began coughing, was congested and had leg a peri-orbital aches. Used her inhaler yesterday for mild wheezing; no OTCs since yesterday. NO influenza vacc this season; no travel outside [place] past 3-4 weeks.

## 2018-12-01 NOTE — ED Provider Notes (Signed)
[facility]    CSN: [id number] Arrival date & time: 12/01/18  1138     History   Chief Complaint Chief Complaint  Patient presents with  . Cough  . Wheezing  . Nasal Congestion    HPI Lauren Harrell is a 23 y.o. female.   HPI Patient was in her usual state of health until Friday..  She had some cough with wheezing.  She has a history of asthma and does vape.  Following this she developed some fever associated with severe myalgias in her legs.  She has had no nausea or vomiting.  She has not taken any medication except she did use the her albuterol inhaler.  She denies any recent travel out of the state.  She denies any exposure to a known patient with Covid 19 History reviewed. No pertinent past medical history.  There are no active problems to display for this patient.   History reviewed. No pertinent surgical history.  OB History    Gravida  0   Para  0   Term  0   Preterm  0   AB  0   Living  0     SAB  0   TAB  0   Ectopic  0   Multiple  0   Live Births  0            Home Medications    Prior to Admission medications   Medication Sig Start Date End Date Taking? Authorizing Provider  albuterol (PROVENTIL HFA;VENTOLIN HFA) 108 (90 Base) MCG/ACT inhaler Inhale 1-2 puffs into the lungs every 6 (six) hours as needed for wheezing or shortness of breath. 12/01/18   Collene Gobble, MD  benzonatate (TESSALON) 100 MG capsule Take 1-2 capsules (100-200 mg total) by mouth 3 (three) times daily as needed for cough. 12/01/18   Collene Gobble, MD  levonorgestrel-ethinyl estradiol (SEASONALE,INTROVALE,JOLESSA) 0.15-0.03 MG tablet TAKE 1 TABLET BY MOUTH EVERY DAY 10/16/18   Allie Bossier, MD  oseltamivir (TAMIFLU) 75 MG capsule Take 1 capsule (75 mg total) by mouth every 12 (twelve) hours. 12/01/18   Collene Gobble, MD    Family History Family History  Problem Relation Age of Onset  . Hypertension Mother   . Hypertension Maternal Grandfather      Social History Social History   Tobacco Use  . Smoking status: Never Smoker  . Smokeless tobacco: Never Used  Substance Use Topics  . Alcohol use: No  . Drug use: No     Allergies   Amoxicillin   Review of Systems Review of Systems  Constitutional: Positive for fever.  HENT: Positive for congestion. Negative for sore throat.   Eyes: Negative.   Respiratory: Positive for cough and wheezing.   Cardiovascular: Negative.   Gastrointestinal: Negative for nausea.  Musculoskeletal: Positive for myalgias.     Physical Exam Triage Vital Signs ED Triage Vitals [12/01/18 1216]  Enc Vitals Group     BP 115/80     Pulse Rate (!) 115     Resp 18     Temp 99.9 F (37.7 C)     Temp Source Oral     SpO2 99 %     Weight      Height      Head Circumference      Peak Flow      Pain Score      Pain Loc      Pain Edu?  Excl. in GC?    No data found.  Updated Vital Signs BP 115/80 (BP Location: Right Arm)   Pulse (!) 115   Temp 99.9 F (37.7 C) (Oral)   Resp 18   Ht 5' 2.5" (1.588 m)   Wt 58.1 kg   SpO2 99%   BMI 23.04 kg/m   Visual Acuity Right Eye Distance:   Left Eye Distance:   Bilateral Distance:    Right Eye Near:   Left Eye Near:    Bilateral Near:     Physical Exam Constitutional:      Appearance: Normal appearance.  HENT:     Right Ear: Tympanic membrane normal.     Left Ear: Tympanic membrane normal.     Mouth/Throat:     Pharynx: No oropharyngeal exudate or posterior oropharyngeal erythema.  Eyes:     Pupils: Pupils are equal, round, and reactive to light.  Neck:     Musculoskeletal: Normal range of motion.  Cardiovascular:     Rate and Rhythm: Tachycardia present.     Heart sounds: No murmur.  Pulmonary:     Effort: Pulmonary effort is normal.     Breath sounds: Wheezing present. No rales.  Neurological:     Mental Status: She is alert.      UC Treatments / Results  Labs (all labs ordered are listed, but only abnormal results  are displayed) Labs Reviewed  POCT INFLUENZA A/B    EKG None  Radiology No results found.  Procedures Procedures (including critical care time)  Medications Ordered in UC Medications - No data to display  Initial Impression / Assessment and Plan / UC Course  I have reviewed the triage vital signs and the nursing notes.  Pertinent labs & imaging results that were available during my care of the patient were reviewed by me and considered in my medical decision making (see chart for details). Patient positive influenza A.  Will treat with Tamiflu.  Albuterol inhaler refilled.  She was also given Tessalon Perles for cough.     Final Clinical Impressions(s) / UC Diagnoses   Final diagnoses:  Influenza A     Discharge Instructions     Take Tamiflu twice a day. Use inhaler every 4-6 hours. You have cough capsules to take. Return to clinic if worsening chest pain or shortness of breath.    ED Prescriptions    Medication Sig Dispense Auth. Provider   oseltamivir (TAMIFLU) 75 MG capsule Take 1 capsule (75 mg total) by mouth every 12 (twelve) hours. 10 capsule Collene Gobble, MD   benzonatate (TESSALON) 100 MG capsule Take 1-2 capsules (100-200 mg total) by mouth 3 (three) times daily as needed for cough. 40 capsule Collene Gobble, MD   albuterol (PROVENTIL HFA;VENTOLIN HFA) 108 (90 Base) MCG/ACT inhaler Inhale 1-2 puffs into the lungs every 6 (six) hours as needed for wheezing or shortness of breath. 1 Inhaler Collene Gobble, MD     Controlled Substance Prescriptions [place] Controlled Substance Registry consulted? Not Applicable   Collene Gobble, MD 12/01/18 1258

## 2018-12-01 NOTE — Discharge Instructions (Signed)
Take Tamiflu twice a day. Use inhaler every 4-6 hours. You have cough capsules to take. Return to clinic if worsening chest pain or shortness of breath.

## 2019-04-24 ENCOUNTER — Emergency Department (INDEPENDENT_AMBULATORY_CARE_PROVIDER_SITE_OTHER)
Admission: EM | Admit: 2019-04-24 | Discharge: 2019-04-24 | Disposition: A | Discharge status: Home / Self Care | Payer: BC Managed Care – PPO | Source: Home / Self Care

## 2019-04-24 ENCOUNTER — Other Ambulatory Visit: Payer: Self-pay

## 2019-04-24 ENCOUNTER — Encounter: Payer: Self-pay | Admitting: Family Medicine

## 2019-04-24 ENCOUNTER — Emergency Department (INDEPENDENT_AMBULATORY_CARE_PROVIDER_SITE_OTHER): Payer: BC Managed Care – PPO

## 2019-04-24 DIAGNOSIS — R079 Chest pain, unspecified: Secondary | ICD-10-CM

## 2019-04-24 DIAGNOSIS — S299XXA Unspecified injury of thorax, initial encounter: Secondary | ICD-10-CM | POA: Diagnosis not present

## 2019-04-24 DIAGNOSIS — R0789 Other chest pain: Secondary | ICD-10-CM | POA: Diagnosis not present

## 2019-04-24 MED ORDER — PREDNISONE 20 MG PO TABS: ORAL_TABLET | ORAL | 1 refills | Status: DC

## 2019-04-24 NOTE — Discharge Instructions (Addendum)
You have bruised the long thoracic nerve which runs vertically in the lateral chest.  This may take several more weeks to completely resolve.   Usually, an anti-inflammatory helps.  When the ibuprofen fails, we use cortisone tablets which offer stronger anti-inflammatory properties.

## 2019-04-24 NOTE — ED Triage Notes (Signed)
Pt fell on arm of couch about 3 weeks ago with all her weight on the left rib area.  Has been hurting with coughing and breathing the last few days.

## 2019-04-24 NOTE — ED Provider Notes (Signed)
[name]    CSN: [id number] Arrival date & time: 04/24/19  1706     History   Chief Complaint Chief Complaint  Patient presents with  . Chest Pain    rib   HPI Lauren Harrell is a 23 y.o. female.   Established [place] 23 yo woman with chest wall pain.  Pt fell on arm of couch about 3 weeks ago with all her weight on the left rib area.  Has been hurting with coughing and breathing more the last few days.  Pain radiates along anterior aspect of lateral chest from mid left rib section to the anterior left axilla.  No significant cough or shortness of breath.     History reviewed. No pertinent past medical history.  There are no active problems to display for this patient.   History reviewed. No pertinent surgical history.  OB History    Gravida  0   Para  0   Term  0   Preterm  0   AB  0   Living  0     SAB  0   TAB  0   Ectopic  0   Multiple  0   Live Births  0            Home Medications    Prior to Admission medications   Medication Sig Start Date End Date Taking? Authorizing Provider  albuterol (PROVENTIL HFA;VENTOLIN HFA) 108 (90 Base) MCG/ACT inhaler Inhale 1-2 puffs into the lungs every 6 (six) hours as needed for wheezing or shortness of breath. 12/01/18   Darlyne Russian, MD  levonorgestrel-ethinyl estradiol (SEASONALE,INTROVALE,JOLESSA) 0.15-0.03 MG tablet TAKE 1 TABLET BY MOUTH EVERY DAY 10/16/18   Emily Filbert, MD  predniSONE (DELTASONE) 20 MG tablet 2 daily with food 04/24/19   Robyn Haber, MD    Family History Family History  Problem Relation Age of Onset  . Hypertension Mother   . Hypertension Maternal Grandfather     Social History Social History   Tobacco Use  . Smoking status: Current Every Day Smoker    Types: E-cigarettes  . Smokeless tobacco: Never Used  Substance Use Topics  . Alcohol use: No  . Drug use: No     Allergies   Amoxicillin   Review of Systems Review of Systems  Respiratory: Positive  for chest tightness. Negative for cough and shortness of breath.   Cardiovascular: Positive for chest pain.  All other systems reviewed and are negative.    Physical Exam Triage Vital Signs ED Triage Vitals  Enc Vitals Group     BP      Pulse      Resp      Temp      Temp src      SpO2      Weight      Height      Head Circumference      Peak Flow      Pain Score      Pain Loc      Pain Edu?      Excl. in [place]?    No data found.  Updated Vital Signs BP (!) 141/82 (BP Location: Right Arm)   Pulse (!) 109   Temp 98.4 F (36.9 C) (Oral)   Resp 20   Ht 5\' 2"  (1.575 m)   Wt 55.8 kg   LMP 04/10/2019   SpO2 99%   BMI 22.50 kg/m    Physical Exam Vitals signs and  nursing note reviewed.  Constitutional:      Appearance: She is well-developed.  HENT:     Head: Normocephalic.  Eyes:     Pupils: Pupils are equal, round, and reactive to light.  Neck:     Musculoskeletal: Normal range of motion and neck supple.  Cardiovascular:     Rate and Rhythm: Normal rate and regular rhythm.     Heart sounds: Normal heart sounds.  Pulmonary:     Effort: Pulmonary effort is normal.     Breath sounds: Normal breath sounds.  Chest:     Chest wall: Tenderness present.     Comments: Tenderness along path of long thoracic nerve. Musculoskeletal: Normal range of motion.     Comments: No winging of scapula  Skin:    General: Skin is warm and dry.  Neurological:     General: No focal deficit present.     Mental Status: She is alert and oriented to person, place, and time.  Psychiatric:        Mood and Affect: Mood normal.        Behavior: Behavior normal.      UC Treatments / Results  Labs (all labs ordered are listed, but only abnormal results are displayed) Labs Reviewed - No data to display  EKG   Radiology Dg Ribs Unilateral W/chest Left  Result Date: 04/24/2019 CLINICAL DATA:  Fall with chest wall strike 3 weeks prior EXAM: LEFT RIBS AND CHEST - 3+ VIEW COMPARISON:   None. FINDINGS: Small calcified nodule present in the right lung base, likely granuloma. No consolidation, features of edema, pneumothorax, or effusion. Pulmonary vascularity is normally distributed. The cardiomediastinal contours are unremarkable. No visible acute displaced rib fractures are seen. No other traumatic osseous abnormality. Soft tissues are unremarkable. IMPRESSION: 1. No visible acute displaced rib fractures. 2. No acute cardiopulmonary disease. Electronically Signed   By: Kreg ShropshirePrice  DeHay M.D.   On: 04/24/2019 17:38    Procedures Procedures (including critical care time)  Medications Ordered in UC Medications - No data to display  Initial Impression / Assessment and Plan / UC Course  I have reviewed the triage vital signs and the nursing notes.  Pertinent labs & imaging results that were available during my care of the patient were reviewed by me and considered in my medical decision making (see chart for details).     Final Clinical Impressions(s) / UC Diagnoses   Final diagnoses:  Chest wall pain     Discharge Instructions     You have bruised the long thoracic nerve which runs vertically in the lateral chest.  This may take several more weeks to completely resolve.   Usually, an anti-inflammatory helps.  When the ibuprofen fails, we use cortisone tablets which offer stronger anti-inflammatory properties.    ED Prescriptions    Medication Sig Dispense Auth. Provider   predniSONE (DELTASONE) 20 MG tablet 2 daily with food 10 tablet Elvina SidleLauenstein, Bengie Kaucher, MD     Controlled Substance Prescriptions [place] Controlled Substance Registry consulted? Not Applicable   Elvina SidleLauenstein, Peony Barner, MD 04/24/19 1743

## 2019-06-26 ENCOUNTER — Telehealth: Payer: Self-pay

## 2019-06-26 DIAGNOSIS — Z3009 Encounter for other general counseling and advice on contraception: Secondary | ICD-10-CM

## 2019-06-26 DIAGNOSIS — Z01419 Encounter for gynecological examination (general) (routine) without abnormal findings: Secondary | ICD-10-CM

## 2019-06-26 MED ORDER — LEVONORGEST-ETH ESTRAD 91-DAY 0.15-0.03 MG PO TABS: 1.0000 | ORAL_TABLET | Freq: Every day | ORAL | 0 refills | Status: DC

## 2019-06-26 NOTE — Telephone Encounter (Addendum)
Pt requesting OCP refill. Pt is due for annual. One month of OCP sent to pharmacy. Appt scheduled for 10/29.

## 2019-07-17 ENCOUNTER — Ambulatory Visit: Payer: BC Managed Care – PPO | Admitting: Obstetrics & Gynecology

## 2019-08-27 ENCOUNTER — Telehealth: Payer: Self-pay | Admitting: *Deleted

## 2019-08-27 NOTE — Telephone Encounter (Signed)
Left patient a message to call and answer screening questions prior to appointment on 08/27/2019 at 4:00 PM with a 3:45 PM arrival.

## 2019-08-28 ENCOUNTER — Ambulatory Visit (INDEPENDENT_AMBULATORY_CARE_PROVIDER_SITE_OTHER): Payer: BC Managed Care – PPO | Admitting: Obstetrics & Gynecology

## 2019-08-28 ENCOUNTER — Encounter: Payer: Self-pay | Admitting: Obstetrics & Gynecology

## 2019-08-28 ENCOUNTER — Other Ambulatory Visit: Payer: Self-pay

## 2019-08-28 VITALS — BP 121/79 | HR 79 | Ht 62.0 in | Wt 121.0 lb

## 2019-08-28 DIAGNOSIS — Z124 Encounter for screening for malignant neoplasm of cervix: Secondary | ICD-10-CM | POA: Diagnosis not present

## 2019-08-28 DIAGNOSIS — Z01419 Encounter for gynecological examination (general) (routine) without abnormal findings: Secondary | ICD-10-CM

## 2019-08-28 NOTE — Progress Notes (Signed)
Subjective:    Lauren Harrell is a 23 y.o.single G0 who presents for an annual exam. The patient has no complaints today. She is happy with her OCPs. The patient is not currently sexually active. GYN screening history: last pap: was normal. The patient wears seatbelts: yes. The patient participates in regular exercise: yes. Has the patient ever been transfused or tattooed?: yes. The patient reports that there is not domestic violence in her life.   Menstrual History: OB History    Gravida  0   Para  0   Term  0   Preterm  0   AB  0   Living  0     SAB  0   TAB  0   Ectopic  0   Multiple  0   Live Births  0           Menarche age: 27 Patient's last menstrual period was 06/19/2019.    The following portions of the patient's history were reviewed and updated as appropriate: allergies, current medications, past family history, past medical history, past social history, past surgical history and problem list.  Review of Systems Pertinent items are noted in HPI.   FH- + breast cancer paternal GM, no gyn/colon cancer Serves at [facility] for 3 years   Objective:    BP 121/79   Pulse 79   Ht 5\' 2"  (1.575 m)   Wt 121 lb (54.9 kg)   LMP 06/19/2019   BMI 22.13 kg/m   General Appearance:    Alert, cooperative, no distress, appears stated age  Head:    Normocephalic, without obvious abnormality, atraumatic  Eyes:    PERRL, conjunctiva/corneas clear, EOM's intact, fundi    benign, both eyes  Ears:    Normal TM's and external ear canals, both ears  Nose:   Nares normal, septum midline, mucosa normal, no drainage    or sinus tenderness  Throat:   Lips, mucosa, and tongue normal; teeth and gums normal  Neck:   Supple, symmetrical, trachea midline, no adenopathy;    thyroid:  no enlargement/tenderness/nodules; no carotid   bruit or JVD  Back:     Symmetric, no curvature, ROM normal, no CVA tenderness  Lungs:     Clear to auscultation bilaterally,  respirations unlabored  Chest Wall:    No tenderness or deformity   Heart:    Regular rate and rhythm, S1 and S2 normal, no murmur, rub   or gallop  Breast Exam:    No tenderness, masses, or nipple abnormality  Abdomen:     Soft, non-tender, bowel sounds active all four quadrants,    no masses, no organomegaly  Genitalia:    Normal female without lesion, discharge or tenderness, normal size and shape, anteverted, mobile, non-tender, normal adnexal exam      Extremities:   Extremities normal, atraumatic, no cyanosis or edema  Pulses:   2+ and symmetric all extremities  Skin:   Skin color, texture, turgor normal, no rashes or lesions  Lymph nodes:   Cervical, supraclavicular, and axillary nodes normal  Neurologic:   CNII-XII intact, normal strength, sensation and reflexes    throughout  .    Assessment:    Healthy female exam.    Plan:     Thin prep Pap smear.

## 2019-09-01 LAB — CYTOLOGY - PAP: Diagnosis: NEGATIVE

## 2019-09-22 ENCOUNTER — Other Ambulatory Visit: Payer: Self-pay | Admitting: Obstetrics & Gynecology

## 2019-09-22 DIAGNOSIS — Z01419 Encounter for gynecological examination (general) (routine) without abnormal findings: Secondary | ICD-10-CM

## 2019-09-22 DIAGNOSIS — Z3009 Encounter for other general counseling and advice on contraception: Secondary | ICD-10-CM

## 2019-12-15 ENCOUNTER — Telehealth: Payer: Self-pay

## 2019-12-15 DIAGNOSIS — Z3009 Encounter for other general counseling and advice on contraception: Secondary | ICD-10-CM

## 2019-12-15 DIAGNOSIS — Z01419 Encounter for gynecological examination (general) (routine) without abnormal findings: Secondary | ICD-10-CM

## 2019-12-15 MED ORDER — LEVONORGEST-ETH ESTRAD 91-DAY 0.15-0.03 MG PO TABS: 1.0000 | ORAL_TABLET | Freq: Every day | ORAL | 4 refills | Status: DC

## 2019-12-15 NOTE — Telephone Encounter (Signed)
Pt's mother called needing refill of OCP for pt. Refill sent.

## 2020-12-13 IMAGING — DX LEFT RIBS AND CHEST - 3+ VIEW
3 series · 3 of 3 positions shown · non-contrast
Comparison: None.

CLINICAL DATA: Fall with chest wall strike 3 weeks prior

EXAM:
LEFT RIBS AND CHEST - 3+ VIEW

[chest pa]
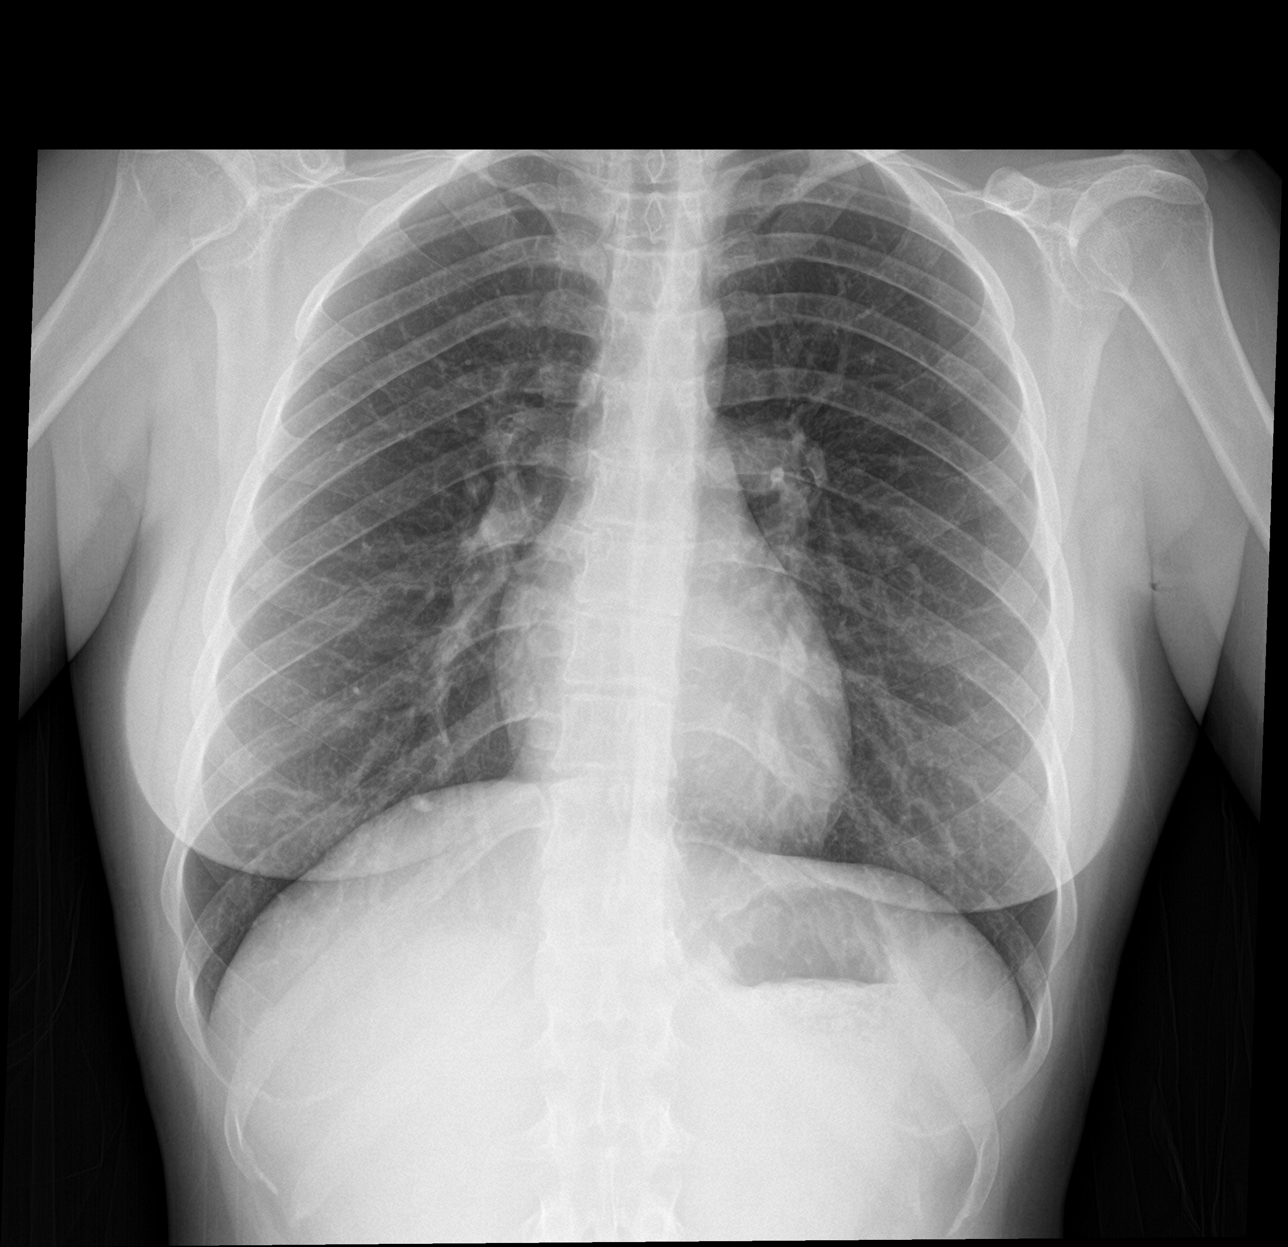

[rib pa]
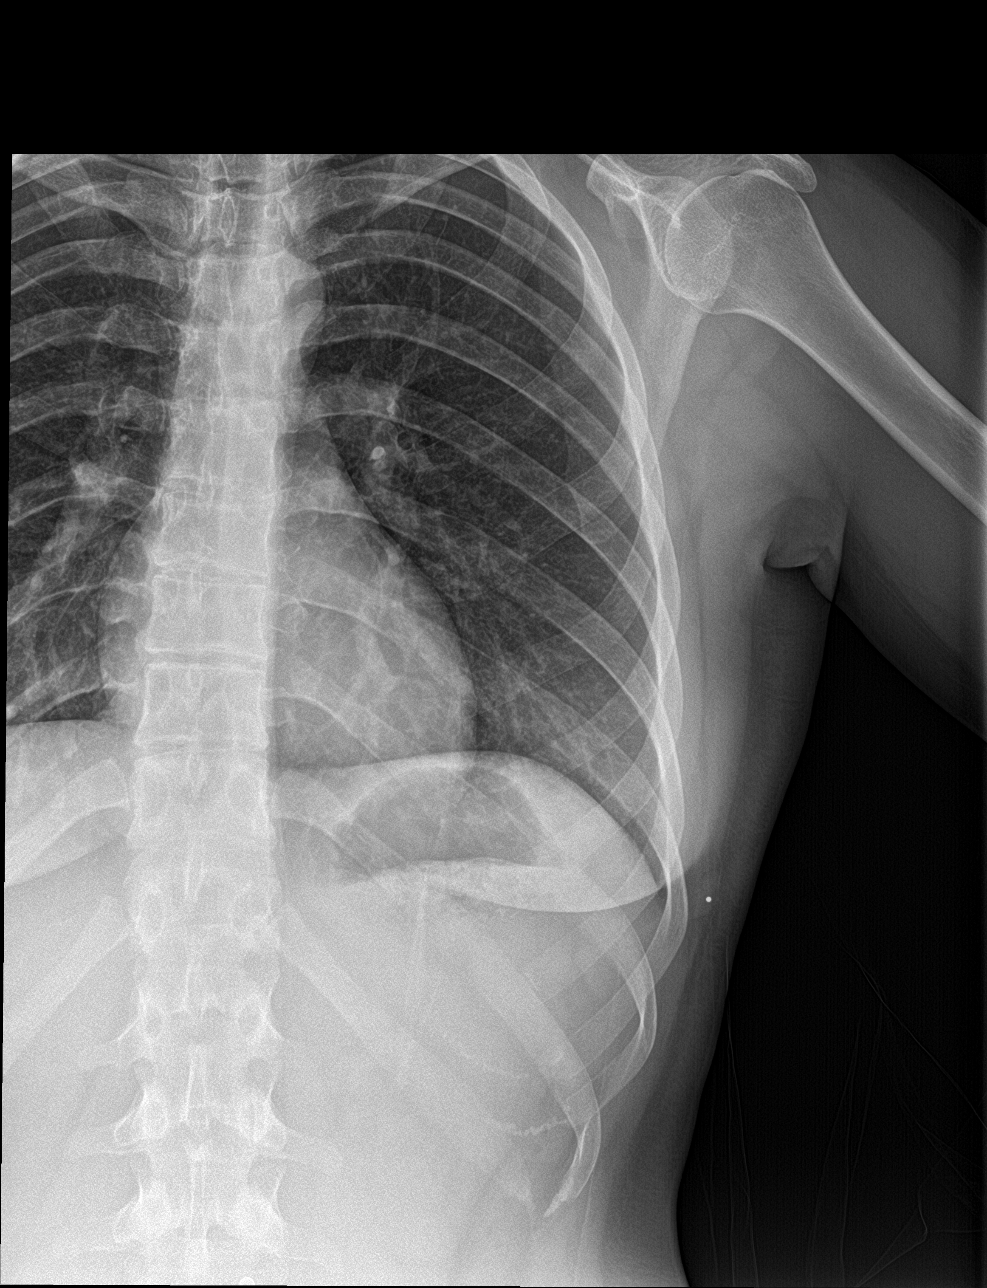

[rib pa obl]
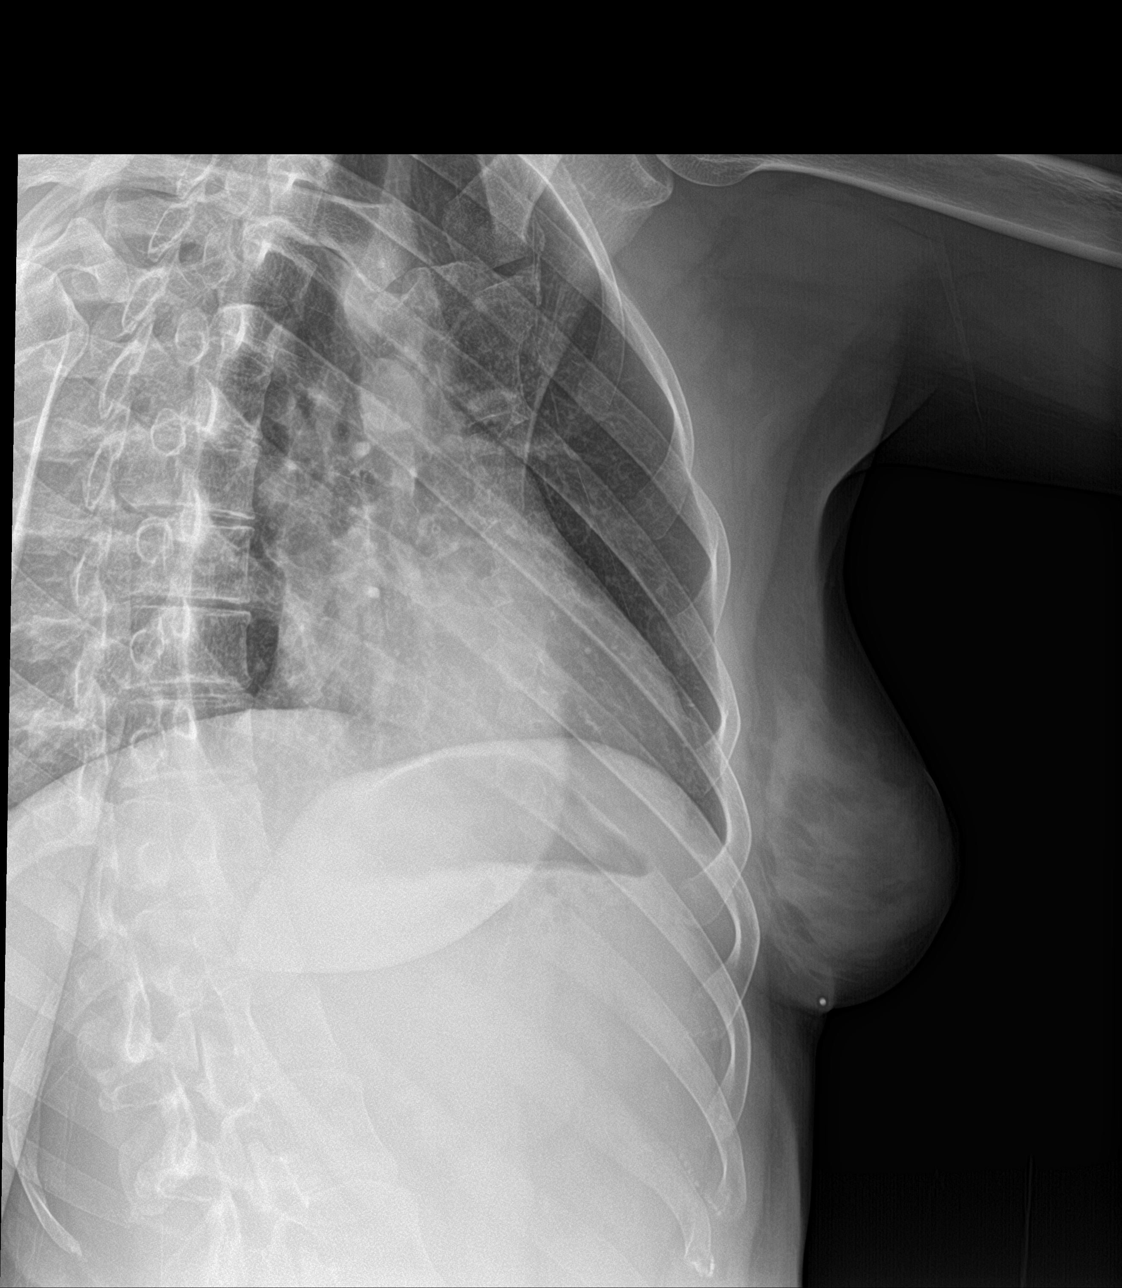

[3 of 3 positions shown; findings below may reference images not displayed]

FINDINGS: Small calcified nodule present in the right lung base, likely
granuloma. No consolidation, features of edema, pneumothorax, or
effusion. Pulmonary vascularity is normally distributed. The
cardiomediastinal contours are unremarkable.

No visible acute displaced rib fractures are seen. No other
traumatic osseous abnormality. Soft tissues are unremarkable.
IMPRESSION: 1. No visible acute displaced rib fractures.
2. No acute cardiopulmonary disease.

## 2021-03-29 ENCOUNTER — Telehealth: Payer: Self-pay | Admitting: *Deleted

## 2021-03-29 NOTE — Telephone Encounter (Signed)
Left patient an urgent message to call and reschedule appointment on 03/31/2021 due to Dr. Earlene Plater being out for the afternoon. Dr. Earlene Plater does have a 11:00 AM available.

## 2021-03-30 ENCOUNTER — Telehealth: Payer: Self-pay | Admitting: *Deleted

## 2021-03-30 NOTE — Telephone Encounter (Signed)
Left patient an urgent message to call the office to move appointment up on 03/31/2021 or reschedule for a different day.

## 2021-03-31 ENCOUNTER — Ambulatory Visit: Payer: BC Managed Care – PPO | Admitting: Obstetrics and Gynecology

## 2021-04-05 ENCOUNTER — Telehealth: Payer: Self-pay | Admitting: *Deleted

## 2021-04-05 NOTE — Telephone Encounter (Signed)
Left patient an urgent message to call and reschedule annual appointment as soon as possible. Will not be able to refill birth control pills without an annual.

## 2021-04-05 NOTE — Telephone Encounter (Signed)
Left urgent message for patient's mother to have herself or patient call to reschedule annual appointment as soon as possible. Office will not be able to refill birth control without a completed annual.

## 2021-04-23 ENCOUNTER — Other Ambulatory Visit: Payer: Self-pay

## 2021-04-23 ENCOUNTER — Encounter: Payer: Self-pay | Admitting: Emergency Medicine

## 2021-04-23 ENCOUNTER — Emergency Department (INDEPENDENT_AMBULATORY_CARE_PROVIDER_SITE_OTHER)
Admission: EM | Admit: 2021-04-23 | Discharge: 2021-04-23 | Disposition: A | Discharge status: Home / Self Care | Payer: 59 | Source: Home / Self Care | Attending: Family Medicine | Admitting: Family Medicine

## 2021-04-23 DIAGNOSIS — H00024 Hordeolum internum left upper eyelid: Secondary | ICD-10-CM | POA: Diagnosis not present

## 2021-04-23 MED ORDER — TOBRAMYCIN 0.3 % OP SOLN: 1.0000 [drp] | OPHTHALMIC | 0 refills | Status: DC

## 2021-04-23 NOTE — Discharge Instructions (Addendum)
Use warm compresses for 20 minutes every couple of hours until discomfort improves Use the eyedrops for 3 to 5 days See an eye doctor if not improving by Monday, or if symptoms worsen in spite of treatment

## 2021-04-23 NOTE — ED Triage Notes (Signed)
Patient states that she started having some discomfort with her left eye a couple of days ago.  The left eyelid started swelling last night and today.  Patient did try a new mascara this week.  No visual disturbances, no contacts or glasses.

## 2021-04-23 NOTE — ED Provider Notes (Signed)
[facility]    CSN: [id number] Arrival date & time: 04/23/21  1355      History   Chief Complaint Chief Complaint  Patient presents with   Eye Problem    HPI Lauren Harrell is a 25 y.o. female.   HPI Patient has some eye irritation for the last few days.  It feels "grainy" like there is something in it.  She has redness.  Some tearing.  It is in the upper lid on the left side.  No other symptoms, visual problems, right eye involvement.  Patient does not wear contact lens.  She is a healthy 25 year old. History reviewed. No pertinent past medical history.  There are no problems to display for this patient.   History reviewed. No pertinent surgical history.  OB History     Gravida  0   Para  0   Term  0   Preterm  0   AB  0   Living  0      SAB  0   IAB  0   Ectopic  0   Multiple  0   Live Births  0            Home Medications    Prior to Admission medications   Medication Sig Start Date End Date Taking? Authorizing Provider  levonorgestrel-ethinyl estradiol (SEASONALE) 0.15-0.03 MG tablet Take 1 tablet by mouth daily. 12/15/19  Yes Dove, Myra C, MD  tobramycin (TOBREX) 0.3 % ophthalmic solution Place 1 drop into both eyes every 4 (four) hours. 04/23/21  Yes Eustace Moore, MD    Family History Family History  Problem Relation Age of Onset   Hypertension Mother    Hypertension Maternal Grandfather     Social History Social History   Tobacco Use   Smoking status: Every Day    Types: E-cigarettes   Smokeless tobacco: Never  Vaping Use   Vaping Use: Every day   Substances: Nicotine, Flavoring  Substance Use Topics   Alcohol use: Yes    Comment: socially   Drug use: No     Allergies   Amoxicillin   Review of Systems Review of Systems  See HPI Physical Exam Triage Vital Signs ED Triage Vitals  Enc Vitals Group     BP 04/23/21 1436 105/70     Pulse Rate 04/23/21 1436 82     Resp --      Temp 04/23/21 1436 98.9  F (37.2 C)     Temp Source 04/23/21 1436 Oral     SpO2 04/23/21 1436 98 %     Weight 04/23/21 1438 118 lb (53.5 kg)     Height 04/23/21 1438 5\' 2"  (1.575 m)     Head Circumference --      Peak Flow --      Pain Score 04/23/21 1437 6     Pain Loc --      Pain Edu? --      Excl. in [place]? --    No data found.  Updated Vital Signs BP 105/70 (BP Location: Left Arm)   Pulse 82   Temp 98.9 F (37.2 C) (Oral)   Ht 5\' 2"  (1.575 m)   Wt 53.5 kg   LMP 04/01/2021   SpO2 98%   BMI 21.58 kg/m      Physical Exam Constitutional:      General: She is not in acute distress.    Appearance: She is well-developed.  HENT:     Head:  Normocephalic and atraumatic.     Nose:     Comments: Mask is in place Eyes:     General: Lids are normal. Lids are everted, no foreign bodies appreciated. Vision grossly intact. Gaze aligned appropriately.        Left eye: Hordeolum present.    Conjunctiva/sclera: Conjunctivae normal.     Pupils: Pupils are equal, round, and reactive to light.   Cardiovascular:     Rate and Rhythm: Normal rate.  Pulmonary:     Effort: Pulmonary effort is normal. No respiratory distress.  Abdominal:     General: There is no distension.     Palpations: Abdomen is soft.  Musculoskeletal:        General: Normal range of motion.     Cervical back: Normal range of motion.  Skin:    General: Skin is warm and dry.  Neurological:     Mental Status: She is alert.     UC Treatments / Results  Labs (all labs ordered are listed, but only abnormal results are displayed) Labs Reviewed - No data to display  EKG   Radiology No results found.  Procedures Procedures (including critical care time)  Medications Ordered in UC Medications - No data to display  Initial Impression / Assessment and Plan / UC Course  I have reviewed the triage vital signs and the nursing notes.  Pertinent labs & imaging results that were available during my care of the patient were reviewed  by me and considered in my medical decision making (see chart for details).     Care discussed.  Reasons for return. Final Clinical Impressions(s) / UC Diagnoses   Final diagnoses:  Hordeolum internum of left upper eyelid     Discharge Instructions      Use warm compresses for 20 minutes every couple of hours until discomfort improves Use the eyedrops for 3 to 5 days See an eye doctor if not improving by Monday, or if symptoms worsen in spite of treatment   ED Prescriptions     Medication Sig Dispense Auth. Provider   tobramycin (TOBREX) 0.3 % ophthalmic solution Place 1 drop into both eyes every 4 (four) hours. 5 mL Eustace Moore, MD      PDMP not reviewed this encounter.   Eustace Moore, MD 04/23/21 [phone]

## 2021-11-02 ENCOUNTER — Other Ambulatory Visit: Payer: Self-pay

## 2021-11-02 ENCOUNTER — Emergency Department (INDEPENDENT_AMBULATORY_CARE_PROVIDER_SITE_OTHER)
Admission: EM | Admit: 2021-11-02 | Discharge: 2021-11-02 | Disposition: A | Discharge status: Home / Self Care | Payer: 59 | Source: Home / Self Care

## 2021-11-02 ENCOUNTER — Encounter: Payer: Self-pay | Admitting: Emergency Medicine

## 2021-11-02 DIAGNOSIS — R300 Dysuria: Secondary | ICD-10-CM | POA: Diagnosis not present

## 2021-11-02 LAB — POCT URINALYSIS DIP (MANUAL ENTRY)
Glucose, UA: 100 mg/dL — AB
Nitrite, UA: POSITIVE — AB
Protein Ur, POC: 100 mg/dL — AB
Spec Grav, UA: 1.025 (ref 1.010–1.025)
Urobilinogen, UA: 4 E.U./dL — AB
pH, UA: 5 (ref 5.0–8.0)

## 2021-11-02 MED ORDER — SULFAMETHOXAZOLE-TRIMETHOPRIM 800-160 MG PO TABS: 1.0000 | ORAL_TABLET | Freq: Two times a day (BID) | ORAL | 0 refills | Status: AC

## 2021-11-02 NOTE — ED Triage Notes (Signed)
Urgency & frequency x 3 days  Took AZO last night  & today at 1500 No history of kidney stones Burning w/ urination  Denies fever

## 2021-11-02 NOTE — Discharge Instructions (Addendum)
Advised patient to take medication as directed with food to completion.  Encouraged patient to increase daily water intake while taking this medication.  Advised patient we will follow-up with her once urine culture results return. °

## 2021-11-02 NOTE — ED Provider Notes (Signed)
[name]    CSN: [id number] Arrival date & time: 11/02/21  1709      History   Chief Complaint Chief Complaint  Patient presents with   Dysuria    HPI Lauren Harrell is a 26 y.o. female.   HPI 26 year old female presents with urinary urgency and frequency for 3 days.  Patient reports took Azo last night and today at 3 PM.  Denies fever.  History reviewed. No pertinent past medical history.  There are no problems to display for this patient.   History reviewed. No pertinent surgical history.  OB History     Gravida  0   Para  0   Term  0   Preterm  0   AB  0   Living  0      SAB  0   IAB  0   Ectopic  0   Multiple  0   Live Births  0            Home Medications    Prior to Admission medications   Medication Sig Start Date End Date Taking? Authorizing Provider  sulfamethoxazole-trimethoprim (BACTRIM DS) 800-160 MG tablet Take 1 tablet by mouth 2 (two) times daily for 3 days. 11/02/21 11/05/21 Yes Eliezer Lofts, FNP  levonorgestrel-ethinyl estradiol (SEASONALE) 0.15-0.03 MG tablet Take 1 tablet by mouth daily. Patient not taking: Reported on 11/02/2021 12/15/19   Emily Filbert, MD  tobramycin (TOBREX) 0.3 % ophthalmic solution Place 1 drop into both eyes every 4 (four) hours. Patient not taking: Reported on 11/02/2021 04/23/21   Raylene Everts, MD    Family History Family History  Problem Relation Age of Onset   Hypertension Mother    Healthy Father    Hypertension Maternal Grandfather     Social History Social History   Tobacco Use   Smoking status: Every Day    Types: E-cigarettes   Smokeless tobacco: Never  Vaping Use   Vaping Use: Every day   Substances: Nicotine, Flavoring  Substance Use Topics   Alcohol use: Yes    Comment: socially   Drug use: No     Allergies   Amoxicillin   Review of Systems Review of Systems  Genitourinary:  Positive for dysuria and frequency.  All other systems reviewed and are  negative.   Physical Exam Triage Vital Signs ED Triage Vitals  Enc Vitals Group     BP 11/02/21 1733 102/72     Pulse Rate 11/02/21 1733 (!) 102     Resp 11/02/21 1733 15     Temp 11/02/21 1733 98.4 F (36.9 C)     Temp Source 11/02/21 1733 Oral     SpO2 11/02/21 1733 99 %     Weight 11/02/21 1735 110 lb (49.9 kg)     Height 11/02/21 1735 5\' 2"  (1.575 m)     Head Circumference --      Peak Flow --      Pain Score 11/02/21 1734 2     Pain Loc --      Pain Edu? --      Excl. in [place]? --    No data found.  Updated Vital Signs BP 102/72 (BP Location: Right Arm)    Pulse (!) 102    Temp 98.4 F (36.9 C) (Oral)    Resp 15    Ht 5\' 2"  (1.575 m)    Wt 110 lb (49.9 kg)    LMP 10/10/2021 (Approximate)    SpO2  99%    BMI 20.12 kg/m      Physical Exam Vitals and nursing note reviewed.  Constitutional:      General: She is not in acute distress.    Appearance: Normal appearance. She is normal weight.  HENT:     Head: Normocephalic and atraumatic.     Mouth/Throat:     Mouth: Mucous membranes are moist.     Pharynx: Oropharynx is clear.  Eyes:     Extraocular Movements: Extraocular movements intact.     Conjunctiva/sclera: Conjunctivae normal.     Pupils: Pupils are equal, round, and reactive to light.  Cardiovascular:     Rate and Rhythm: Normal rate and regular rhythm.     Pulses: Normal pulses.     Heart sounds: Normal heart sounds.  Pulmonary:     Effort: Pulmonary effort is normal.     Breath sounds: Normal breath sounds.  Abdominal:     Tenderness: There is no right CVA tenderness or left CVA tenderness.  Musculoskeletal:     Cervical back: Normal range of motion and neck supple.  Skin:    General: Skin is warm and dry.  Neurological:     General: No focal deficit present.     Mental Status: She is alert and oriented to person, place, and time.     UC Treatments / Results  Labs (all labs ordered are listed, but only abnormal results are displayed) Labs  Reviewed  POCT URINALYSIS DIP (MANUAL ENTRY) - Abnormal; Notable for the following components:      Result Value   Color, UA orange (*)    Glucose, UA =100 (*)    Bilirubin, UA moderate (*)    Ketones, POC UA small (15) (*)    Blood, UA small (*)    Protein Ur, POC =100 (*)    Urobilinogen, UA 4.0 (*)    Nitrite, UA Positive (*)    Leukocytes, UA Large (3+) (*)    All other components within normal limits  URINE CULTURE    EKG   Radiology No results found.  Procedures Procedures (including critical care time)  Medications Ordered in UC Medications - No data to display  Initial Impression / Assessment and Plan / UC Course  I have reviewed the triage vital signs and the nursing notes.  Pertinent labs & imaging results that were available during my care of the patient were reviewed by me and considered in my medical decision making (see chart for details).     MDM: 1. Dysuria-Rx'd Bactrim. Advised patient to take medication as directed with food to completion.  Encouraged patient to increase daily water intake while taking this medication.  Advised patient we will follow-up with her once urine culture results return.  Patient discharged home, hemodynamically stable. Final Clinical Impressions(s) / UC Diagnoses   Final diagnoses:  Dysuria     Discharge Instructions      Advised patient to take medication as directed with food to completion.  Encouraged patient to increase daily water intake while taking this medication.  Advised patient we will follow-up with her once urine culture results return.     ED Prescriptions     Medication Sig Dispense Auth. Provider   sulfamethoxazole-trimethoprim (BACTRIM DS) 800-160 MG tablet Take 1 tablet by mouth 2 (two) times daily for 3 days. 6 tablet Eliezer Lofts, FNP      PDMP not reviewed this encounter.   Eliezer Lofts, [place] 11/02/21 1815

## 2021-11-04 LAB — URINE CULTURE
MICRO NUMBER:: 13013891
SPECIMEN QUALITY:: ADEQUATE

## 2021-12-08 ENCOUNTER — Emergency Department (INDEPENDENT_AMBULATORY_CARE_PROVIDER_SITE_OTHER)
Admission: EM | Admit: 2021-12-08 | Discharge: 2021-12-08 | Disposition: A | Discharge status: Home / Self Care | Payer: 59 | Source: Home / Self Care

## 2021-12-08 ENCOUNTER — Encounter: Payer: Self-pay | Admitting: Emergency Medicine

## 2021-12-08 ENCOUNTER — Other Ambulatory Visit: Payer: Self-pay

## 2021-12-08 DIAGNOSIS — J4521 Mild intermittent asthma with (acute) exacerbation: Secondary | ICD-10-CM | POA: Diagnosis not present

## 2021-12-08 DIAGNOSIS — R051 Acute cough: Secondary | ICD-10-CM

## 2021-12-08 MED ORDER — PREDNISONE 20 MG PO TABS: 40.0000 mg | ORAL_TABLET | Freq: Every day | ORAL | 0 refills | Status: AC

## 2021-12-08 MED ORDER — AZITHROMYCIN 250 MG PO TABS: ORAL_TABLET | ORAL | 0 refills | Status: AC

## 2021-12-08 NOTE — ED Provider Notes (Signed)
?[facility] ? ? ? ?CSN: [id number] ?Arrival date & time: 12/08/21  1354 ? ? ?  ? ?History   ?Chief Complaint ?Chief Complaint  ?Patient presents with  ? Cough  ? ? ?HPI ?Lauren Harrell is a 26 y.o. female.  ? ?HPI ?Lauren has asthma.  She had increased use of her inhaler this week.  She has had cough and congestion, or "rattle" in her chest.  Some postnasal drip.  No fever or chills.  No sputum production.  She states she feels "congested". ? ?History reviewed. No pertinent past medical history. ? ?There are no problems to display for this patient. ? ? ?History reviewed. No pertinent surgical history. ? ?OB History   ? ? Gravida  ?0  ? Para  ?0  ? Term  ?0  ? Preterm  ?0  ? AB  ?0  ? Living  ?0  ?  ? ? SAB  ?0  ? IAB  ?0  ? Ectopic  ?0  ? Multiple  ?0  ? Live Births  ?0  ?   ?  ?  ? ? ? ?Home Medications   ? ?Prior to Admission medications   ?Medication Sig Start Date End Date Taking? Authorizing Provider  ?azithromycin (ZITHROMAX Z-PAK) 250 MG tablet Take two pills today followed by one a day until gone 12/08/21  Yes Eustace Moore, MD  ?predniSONE (DELTASONE) 20 MG tablet Take 2 tablets (40 mg total) by mouth daily with breakfast. 12/08/21  Yes Eustace Moore, MD  ? ? ?Family History ?Family History  ?Problem Relation Age of Onset  ? Hypertension Mother   ? Healthy Father   ? Hypertension Maternal Grandfather   ? ? ?Social History ?Social History  ? ?Tobacco Use  ? Smoking status: Every Day  ?  Types: E-cigarettes  ? Smokeless tobacco: Never  ?Vaping Use  ? Vaping Use: Every day  ? Substances: Nicotine, Flavoring  ?Substance Use Topics  ? Alcohol use: Yes  ?  Comment: socially  ? Drug use: No  ? ? ? ?Allergies   ?Amoxicillin ? ? ?Review of Systems ?Review of Systems ?See HPI ? ?Physical Exam ?Triage Vital Signs ?ED Triage Vitals  ?Enc Vitals Group  ?   BP 12/08/21 1417 114/75  ?   Pulse Rate 12/08/21 1417 98  ?   Resp 12/08/21 1417 18  ?   Temp 12/08/21 1417 98.2 ?F (36.8 ?C)  ?   Temp Source 12/08/21  1417 Oral  ?   SpO2 12/08/21 1417 99 %  ?   Weight 12/08/21 1418 110 lb (49.9 kg)  ?   Height 12/08/21 1418 5\' 2"  (1.575 m)  ?   Head Circumference --   ?   Peak Flow --   ?   Pain Score 12/08/21 1418 4  ?   Pain Loc --   ?   Pain Edu? --   ?   Excl. in GC? --   ? ?No data found. ? ?Updated Vital Signs ?BP 114/75 (BP Location: Right Arm)   Pulse 98   Temp 98.2 ?F (36.8 ?C) (Oral)   Resp 18   Ht 5\' 2"  (1.575 m)   Wt 49.9 kg   LMP 12/08/2021   SpO2 99%   BMI 20.12 kg/m?  ?   ? ?Physical Exam ?Constitutional:   ?   General: She is not in acute distress. ?   Appearance: She is well-developed.  ?HENT:  ?   Head: Normocephalic  and atraumatic.  ?   Right Ear: Tympanic membrane and ear canal normal.  ?   Left Ear: Tympanic membrane and ear canal normal.  ?   Nose: Congestion and rhinorrhea present.  ?   Mouth/Throat:  ?   Mouth: Mucous membranes are moist.  ?   Pharynx: No posterior oropharyngeal erythema.  ?Eyes:  ?   Conjunctiva/sclera: Conjunctivae normal.  ?   Pupils: Pupils are equal, round, and reactive to light.  ?Cardiovascular:  ?   Rate and Rhythm: Normal rate and regular rhythm.  ?Pulmonary:  ?   Effort: Pulmonary effort is normal. No respiratory distress.  ?   Breath sounds: Wheezing and rhonchi present.  ?Abdominal:  ?   General: There is no distension.  ?   Palpations: Abdomen is soft.  ?Musculoskeletal:     ?   General: Normal range of motion.  ?   Cervical back: Normal range of motion.  ?Lymphadenopathy:  ?   Cervical: No cervical adenopathy.  ?Skin: ?   General: Skin is warm and dry.  ?Neurological:  ?   Mental Status: She is alert.  ?Psychiatric:     ?   Mood and Affect: Mood normal.     ?   Behavior: Behavior normal.  ? ? ? ?UC Treatments / Results  ?Labs ?(all labs ordered are listed, but only abnormal results are displayed) ?Labs Reviewed - No data to display ? ?EKG ? ? ?Radiology ?No results found. ? ?Procedures ?Procedures (including critical care time) ? ?Medications Ordered in  UC ?Medications - No data to display ? ?Initial Impression / Assessment and Plan / UC Course  ?I have reviewed the triage vital signs and the nursing notes. ? ?Pertinent labs & imaging results that were available during my care of the patient were reviewed by me and considered in my medical decision making (see chart for details). ? ?  ? ?Final Clinical Impressions(s) / UC Diagnoses  ? ?Final diagnoses:  ?Acute cough  ?Mild intermittent asthma with exacerbation  ? ? ? ?Discharge Instructions   ? ?  ?Take the prednisone once a day for 5 days ?Take the Z-Pak as directed.  2 pills today then 1 a day until gone ?Get some Mucinex DM for the cough and congestion ?Call if not improving in a few days ? ? ? ?ED Prescriptions   ? ? Medication Sig Dispense Auth. Provider  ? azithromycin (ZITHROMAX Z-PAK) 250 MG tablet Take two pills today followed by one a day until gone 6 tablet Eustace Moore, MD  ? predniSONE (DELTASONE) 20 MG tablet Take 2 tablets (40 mg total) by mouth daily with breakfast. 10 tablet Eustace Moore, MD  ? ?  ? ?PDMP not reviewed this encounter. ?  ?Eustace Moore, MD ?12/08/21 1444 ? ?

## 2021-12-08 NOTE — Discharge Instructions (Addendum)
Take the prednisone once a day for 5 days ?Take the Z-Pak as directed.  2 pills today then 1 a day until gone ?Get some Mucinex DM for the cough and congestion ?Call if not improving in a few days ?

## 2021-12-08 NOTE — ED Triage Notes (Signed)
Patient c/o wheezing, cough, congestion, some runny nose x 1 week.  Patient denies any OTC meds but has to use her inhaler a couple of times. ?
